# Patient Record
Sex: Female | Born: 1985 | Hispanic: Yes | Marital: Married
Health system: Southern US, Community
[De-identification: ages and names within clinical notes are randomized; demographics above are authoritative.]

---

## 2009-01-09 ENCOUNTER — Emergency Department: Payer: Self-pay | Admitting: Emergency Medicine

## 2009-01-12 ENCOUNTER — Emergency Department: Payer: Self-pay | Admitting: Emergency Medicine

## 2009-01-14 ENCOUNTER — Emergency Department: Payer: Self-pay | Admitting: Emergency Medicine

## 2009-01-27 ENCOUNTER — Emergency Department: Payer: Self-pay | Admitting: Emergency Medicine

## 2009-02-12 ENCOUNTER — Emergency Department: Payer: Self-pay | Admitting: Emergency Medicine

## 2009-02-18 ENCOUNTER — Inpatient Hospital Stay: Payer: Self-pay

## 2009-09-06 ENCOUNTER — Inpatient Hospital Stay: Payer: Self-pay

## 2010-03-21 ENCOUNTER — Ambulatory Visit: Payer: Self-pay | Admitting: Internal Medicine

## 2010-03-21 ENCOUNTER — Inpatient Hospital Stay: Payer: Self-pay | Admitting: Surgery

## 2010-03-23 LAB — PATHOLOGY REPORT

## 2013-12-25 ENCOUNTER — Emergency Department: Payer: Self-pay | Admitting: Emergency Medicine

## 2013-12-25 LAB — COMPREHENSIVE METABOLIC PANEL
ANION GAP: 11 (ref 7–16)
Albumin: 3.9 g/dL (ref 3.4–5.0)
Alkaline Phosphatase: 75 U/L
BILIRUBIN TOTAL: 0.7 mg/dL (ref 0.2–1.0)
BUN: 6 mg/dL — ABNORMAL LOW (ref 7–18)
CHLORIDE: 102 mmol/L (ref 98–107)
CO2: 22 mmol/L (ref 21–32)
CREATININE: 0.51 mg/dL — AB (ref 0.60–1.30)
Calcium, Total: 8.9 mg/dL (ref 8.5–10.1)
Glucose: 98 mg/dL (ref 65–99)
OSMOLALITY: 268 (ref 275–301)
POTASSIUM: 3.8 mmol/L (ref 3.5–5.1)
SGOT(AST): 21 U/L (ref 15–37)
SGPT (ALT): 30 U/L (ref 12–78)
Sodium: 135 mmol/L — ABNORMAL LOW (ref 136–145)
TOTAL PROTEIN: 7.6 g/dL (ref 6.4–8.2)

## 2013-12-25 LAB — URINALYSIS, COMPLETE
BACTERIA: NONE SEEN
BILIRUBIN, UR: NEGATIVE
BLOOD: NEGATIVE
Glucose,UR: NEGATIVE mg/dL (ref 0–75)
Leukocyte Esterase: NEGATIVE
NITRITE: NEGATIVE
Ph: 6 (ref 4.5–8.0)
Protein: 30
RBC,UR: 2 /HPF (ref 0–5)
Specific Gravity: 1.03 (ref 1.003–1.030)
Squamous Epithelial: 4
WBC UR: 2 /HPF (ref 0–5)

## 2013-12-25 LAB — CBC
HCT: 42.4 % (ref 35.0–47.0)
HGB: 14.7 g/dL (ref 12.0–16.0)
MCH: 29.6 pg (ref 26.0–34.0)
MCHC: 34.6 g/dL (ref 32.0–36.0)
MCV: 86 fL (ref 80–100)
Platelet: 224 10*3/uL (ref 150–440)
RBC: 4.96 10*6/uL (ref 3.80–5.20)
RDW: 13.3 % (ref 11.5–14.5)
WBC: 9.4 10*3/uL (ref 3.6–11.0)

## 2013-12-25 LAB — HCG, QUANTITATIVE, PREGNANCY: Beta Hcg, Quant.: 64133 m[IU]/mL — ABNORMAL HIGH

## 2013-12-28 ENCOUNTER — Emergency Department: Payer: Self-pay | Admitting: Emergency Medicine

## 2013-12-29 LAB — COMPREHENSIVE METABOLIC PANEL
ALBUMIN: 3.8 g/dL (ref 3.4–5.0)
ALK PHOS: 76 U/L
ALT: 33 U/L (ref 12–78)
ANION GAP: 11 (ref 7–16)
AST: 20 U/L (ref 15–37)
BILIRUBIN TOTAL: 0.4 mg/dL (ref 0.2–1.0)
BUN: 7 mg/dL (ref 7–18)
Calcium, Total: 8.7 mg/dL (ref 8.5–10.1)
Chloride: 101 mmol/L (ref 98–107)
Co2: 24 mmol/L (ref 21–32)
Creatinine: 0.45 mg/dL — ABNORMAL LOW (ref 0.60–1.30)
EGFR (African American): >60
EGFR (Non-African Amer.): >60
GLUCOSE: 98 mg/dL (ref 65–99)
Osmolality: 270 (ref 275–301)
Potassium: 3.4 mmol/L — ABNORMAL LOW (ref 3.5–5.1)
SODIUM: 136 mmol/L (ref 136–145)
Total Protein: 7.3 g/dL (ref 6.4–8.2)

## 2013-12-29 LAB — CBC WITH DIFFERENTIAL/PLATELET
Basophil #: 0 10*3/uL (ref 0.0–0.1)
Basophil %: 0.3 %
EOS PCT: 1 %
Eosinophil #: 0.1 10*3/uL (ref 0.0–0.7)
HCT: 40.1 % (ref 35.0–47.0)
HGB: 13.8 g/dL (ref 12.0–16.0)
LYMPHS ABS: 1.5 10*3/uL (ref 1.0–3.6)
LYMPHS PCT: 15 %
MCH: 29.1 pg (ref 26.0–34.0)
MCHC: 34.4 g/dL (ref 32.0–36.0)
MCV: 85 fL (ref 80–100)
MONOS PCT: 7.9 %
Monocyte #: 0.8 x10 3/mm (ref 0.2–0.9)
Neutrophil #: 7.5 10*3/uL — ABNORMAL HIGH (ref 1.4–6.5)
Neutrophil %: 75.8 %
Platelet: 203 10*3/uL (ref 150–440)
RBC: 4.74 10*6/uL (ref 3.80–5.20)
RDW: 12.9 % (ref 11.5–14.5)
WBC: 9.8 10*3/uL (ref 3.6–11.0)

## 2013-12-29 LAB — URINALYSIS, COMPLETE
Bilirubin,UR: NEGATIVE
Blood: NEGATIVE
Glucose,UR: NEGATIVE mg/dL (ref 0–75)
Leukocyte Esterase: NEGATIVE
Nitrite: NEGATIVE
Ph: 6 (ref 4.5–8.0)
RBC,UR: 3 /HPF (ref 0–5)
Specific Gravity: 1.032 (ref 1.003–1.030)
WBC UR: 5 /HPF (ref 0–5)

## 2013-12-29 LAB — LIPASE, BLOOD: LIPASE: 199 U/L (ref 73–393)

## 2013-12-29 LAB — HCG, QUANTITATIVE, PREGNANCY: Beta Hcg, Quant.: 80553 m[IU]/mL — ABNORMAL HIGH

## 2013-12-29 LAB — TROPONIN I: Troponin-I: <0.02 ng/mL

## 2014-01-01 ENCOUNTER — Emergency Department: Payer: Self-pay | Admitting: Emergency Medicine

## 2014-01-01 LAB — URINALYSIS, COMPLETE
BILIRUBIN, UR: NEGATIVE
Blood: NEGATIVE
Glucose,UR: NEGATIVE mg/dL (ref 0–75)
Leukocyte Esterase: NEGATIVE
Nitrite: NEGATIVE
Ph: 6 (ref 4.5–8.0)
RBC,UR: 5 /HPF (ref 0–5)
Specific Gravity: 1.031 (ref 1.003–1.030)
Squamous Epithelial: 8
WBC UR: 5 /HPF (ref 0–5)

## 2014-01-01 LAB — BASIC METABOLIC PANEL
Anion Gap: 6 — ABNORMAL LOW (ref 7–16)
BUN: 5 mg/dL — AB (ref 7–18)
CALCIUM: 8.9 mg/dL (ref 8.5–10.1)
CHLORIDE: 103 mmol/L (ref 98–107)
Co2: 22 mmol/L (ref 21–32)
Creatinine: 0.58 mg/dL — ABNORMAL LOW (ref 0.60–1.30)
EGFR (African American): >60
EGFR (Non-African Amer.): >60
GLUCOSE: 103 mg/dL — AB (ref 65–99)
OSMOLALITY: 260 (ref 275–301)
Potassium: 3.6 mmol/L (ref 3.5–5.1)
Sodium: 131 mmol/L — ABNORMAL LOW (ref 136–145)

## 2014-01-01 LAB — CBC
HCT: 42.8 % (ref 35.0–47.0)
HGB: 14.7 g/dL (ref 12.0–16.0)
MCH: 28.8 pg (ref 26.0–34.0)
MCHC: 34.5 g/dL (ref 32.0–36.0)
MCV: 84 fL (ref 80–100)
PLATELETS: 205 10*3/uL (ref 150–440)
RBC: 5.13 10*6/uL (ref 3.80–5.20)
RDW: 13.2 % (ref 11.5–14.5)
WBC: 8.1 10*3/uL (ref 3.6–11.0)

## 2014-01-01 LAB — LIPASE, BLOOD: Lipase: 374 U/L (ref 73–393)

## 2014-01-05 ENCOUNTER — Inpatient Hospital Stay: Payer: Self-pay | Admitting: Obstetrics and Gynecology

## 2014-01-05 LAB — COMPREHENSIVE METABOLIC PANEL
AST: 19 U/L (ref 15–37)
Albumin: 3.5 g/dL (ref 3.4–5.0)
Alkaline Phosphatase: 76 U/L
Anion Gap: 9 (ref 7–16)
BUN: 4 mg/dL — ABNORMAL LOW (ref 7–18)
Bilirubin,Total: 0.5 mg/dL (ref 0.2–1.0)
CREATININE: 0.48 mg/dL — AB (ref 0.60–1.30)
Calcium, Total: 8.5 mg/dL (ref 8.5–10.1)
Chloride: 105 mmol/L (ref 98–107)
Co2: 24 mmol/L (ref 21–32)
EGFR (Non-African Amer.): >60
Glucose: 82 mg/dL (ref 65–99)
OSMOLALITY: 272 (ref 275–301)
Potassium: 3.6 mmol/L (ref 3.5–5.1)
SGPT (ALT): 31 U/L (ref 12–78)
SODIUM: 138 mmol/L (ref 136–145)
Total Protein: 7 g/dL (ref 6.4–8.2)

## 2014-01-05 LAB — CBC WITH DIFFERENTIAL/PLATELET
Basophil #: 0 10*3/uL (ref 0.0–0.1)
Basophil %: 0.3 %
EOS ABS: 0.1 10*3/uL (ref 0.0–0.7)
EOS PCT: 1.3 %
HCT: 39.2 % (ref 35.0–47.0)
HGB: 13.6 g/dL (ref 12.0–16.0)
LYMPHS ABS: 1.5 10*3/uL (ref 1.0–3.6)
Lymphocyte %: 17.5 %
MCH: 29.1 pg (ref 26.0–34.0)
MCHC: 34.7 g/dL (ref 32.0–36.0)
MCV: 84 fL (ref 80–100)
Monocyte #: 0.8 x10 3/mm (ref 0.2–0.9)
Monocyte %: 9.2 %
NEUTROS ABS: 6.1 10*3/uL (ref 1.4–6.5)
NEUTROS PCT: 71.7 %
Platelet: 199 10*3/uL (ref 150–440)
RBC: 4.68 10*6/uL (ref 3.80–5.20)
RDW: 13.1 % (ref 11.5–14.5)
WBC: 8.5 10*3/uL (ref 3.6–11.0)

## 2014-01-05 LAB — URINALYSIS, COMPLETE
Bilirubin,UR: NEGATIVE
Blood: NEGATIVE
GLUCOSE, UR: NEGATIVE mg/dL (ref 0–75)
Leukocyte Esterase: NEGATIVE
Nitrite: NEGATIVE
PH: 6 (ref 4.5–8.0)
Protein: 100
Specific Gravity: 1.026 (ref 1.003–1.030)
Squamous Epithelial: 2

## 2014-01-05 LAB — MAGNESIUM: Magnesium: 1.6 mg/dL — ABNORMAL LOW

## 2014-01-07 LAB — COMPREHENSIVE METABOLIC PANEL
ALK PHOS: 66 U/L
ALT: 36 U/L
Albumin: 2.9 g/dL — ABNORMAL LOW (ref 3.4–5.0)
Anion Gap: 7 (ref 7–16)
BUN: 2 mg/dL — AB (ref 7–18)
Bilirubin,Total: 0.2 mg/dL (ref 0.2–1.0)
CALCIUM: 7.2 mg/dL — AB (ref 8.5–10.1)
CO2: 24 mmol/L (ref 21–32)
Chloride: 105 mmol/L (ref 98–107)
Creatinine: 0.58 mg/dL — ABNORMAL LOW (ref 0.60–1.30)
EGFR (Non-African Amer.): >60
GLUCOSE: 126 mg/dL — AB (ref 65–99)
Osmolality: 270 (ref 275–301)
Potassium: 3.2 mmol/L — ABNORMAL LOW (ref 3.5–5.1)
SGOT(AST): 26 U/L (ref 15–37)
Sodium: 136 mmol/L (ref 136–145)
TOTAL PROTEIN: 5.8 g/dL — AB (ref 6.4–8.2)

## 2014-01-07 LAB — MAGNESIUM: Magnesium: 1.7 mg/dL — ABNORMAL LOW

## 2014-01-07 LAB — T4, FREE: Free Thyroxine: 1.21 ng/dL (ref 0.76–1.46)

## 2014-01-07 LAB — TSH: Thyroid Stimulating Horm: 0.42 u[IU]/mL — ABNORMAL LOW

## 2014-01-09 LAB — URINALYSIS, COMPLETE
BLOOD: NEGATIVE
Bilirubin,UR: NEGATIVE
Ketone: NEGATIVE
Nitrite: NEGATIVE
Ph: 8 (ref 4.5–8.0)
Protein: NEGATIVE
RBC,UR: <1 /HPF (ref 0–5)
Specific Gravity: 1.003 (ref 1.003–1.030)

## 2014-01-11 LAB — BASIC METABOLIC PANEL
Anion Gap: 8 (ref 7–16)
BUN: 1 mg/dL — ABNORMAL LOW (ref 7–18)
CALCIUM: 8 mg/dL — AB (ref 8.5–10.1)
CHLORIDE: 106 mmol/L (ref 98–107)
CREATININE: 0.37 mg/dL — AB (ref 0.60–1.30)
Co2: 22 mmol/L (ref 21–32)
EGFR (Non-African Amer.): >60
GLUCOSE: 105 mg/dL — AB (ref 65–99)
OSMOLALITY: 268 (ref 275–301)
POTASSIUM: 3.6 mmol/L (ref 3.5–5.1)
Sodium: 136 mmol/L (ref 136–145)

## 2014-01-11 LAB — MAGNESIUM: MAGNESIUM: 1.7 mg/dL — AB

## 2014-01-14 LAB — BASIC METABOLIC PANEL
Anion Gap: 10 (ref 7–16)
BUN: 2 mg/dL — ABNORMAL LOW (ref 7–18)
CHLORIDE: 105 mmol/L (ref 98–107)
Calcium, Total: 8.1 mg/dL — ABNORMAL LOW (ref 8.5–10.1)
Co2: 20 mmol/L — ABNORMAL LOW (ref 21–32)
Creatinine: 0.46 mg/dL — ABNORMAL LOW (ref 0.60–1.30)
EGFR (African American): >60
Glucose: 135 mg/dL — ABNORMAL HIGH (ref 65–99)
OSMOLALITY: 268 (ref 275–301)
POTASSIUM: 3.4 mmol/L — AB (ref 3.5–5.1)
SODIUM: 135 mmol/L — AB (ref 136–145)

## 2014-01-14 LAB — MAGNESIUM: Magnesium: 1.5 mg/dL — ABNORMAL LOW

## 2014-01-16 LAB — BASIC METABOLIC PANEL
Anion Gap: 9 (ref 7–16)
BUN: 2 mg/dL — ABNORMAL LOW (ref 7–18)
Calcium, Total: 8.4 mg/dL — ABNORMAL LOW (ref 8.5–10.1)
Chloride: 105 mmol/L (ref 98–107)
Co2: 22 mmol/L (ref 21–32)
Creatinine: 0.42 mg/dL — ABNORMAL LOW (ref 0.60–1.30)
EGFR (African American): >60
Glucose: 84 mg/dL (ref 65–99)
Osmolality: 267 (ref 275–301)
Potassium: 3.7 mmol/L (ref 3.5–5.1)
Sodium: 136 mmol/L (ref 136–145)

## 2014-01-16 LAB — MAGNESIUM: Magnesium: 1.8 mg/dL

## 2014-01-18 LAB — BASIC METABOLIC PANEL
ANION GAP: 7 (ref 7–16)
BUN: 5 mg/dL — ABNORMAL LOW (ref 7–18)
CALCIUM: 8.8 mg/dL (ref 8.5–10.1)
CHLORIDE: 102 mmol/L (ref 98–107)
CO2: 25 mmol/L (ref 21–32)
CREATININE: 0.58 mg/dL — AB (ref 0.60–1.30)
EGFR (African American): >60
EGFR (Non-African Amer.): >60
Glucose: 94 mg/dL (ref 65–99)
OSMOLALITY: 265 (ref 275–301)
Potassium: 3.7 mmol/L (ref 3.5–5.1)
Sodium: 134 mmol/L — ABNORMAL LOW (ref 136–145)

## 2014-01-18 LAB — MAGNESIUM: Magnesium: 1.8 mg/dL

## 2014-03-16 ENCOUNTER — Ambulatory Visit: Payer: Self-pay | Admitting: Physician Assistant

## 2014-04-07 ENCOUNTER — Emergency Department: Payer: Self-pay | Admitting: Student

## 2014-08-05 ENCOUNTER — Inpatient Hospital Stay: Payer: Self-pay | Admitting: Obstetrics and Gynecology

## 2014-10-10 NOTE — Consult Note (Signed)
PIC line in and running.  Pt getting meds via this.  She complains of feeling anxiety after getting the Reglan and this lasts about 15 minutes and goes away.  To lessen the anxiety effects of this medicine I have asked the pharmacy to put it in a 50cc med bag to run over 15 minutes, and will do the same with the Phenergan. I have minimal experience in management of hyperemesis gravidarum and I prefer OB-GYN physicians to make medicine changes or recommendations if alternative medications are needed and i will contribute what I can to fine tune them to help with the patient's particular idiosyncratic side effect.  Electronic Signatures: Scot JunElliott, Wandalee Klang T (MD)  (Signed on 25-Jul-15 15:29)  Authored  Last Updated: 25-Jul-15 15:29 by Scot JunElliott, Kuuipo Anzaldo T (MD)

## 2014-10-10 NOTE — Consult Note (Signed)
May be some component of mucosal irritation from the frequent vomiting she had a few days ago.  She might benefit from liquid carafate one gram 30-60 min before meals, it sticks to inflammed tissue.  I did not previously recommend it for her until I was sure her vomiting had stopped.  Electronic Signatures: Scot JunElliott, Traci Plemons T (MD)  (Signed on 28-Jul-15 13:35)  Authored  Last Updated: 28-Jul-15 13:35 by Scot JunElliott, Salina Stanfield T (MD)

## 2014-10-10 NOTE — Consult Note (Signed)
Pt feeling better, tolerates Zofran better than phenergan/reglan.  Abd not distended, not tender.  Seems to a bit more animated and smiling; better than over weekend. Continue course until you think can be discharged. Slow advance of diet.  Electronic Signatures: Scot JunElliott, Robert T (MD)  (Signed on 27-Jul-15 18:02)  Authored  Last Updated: 27-Jul-15 18:02 by Scot JunElliott, Robert T (MD)

## 2014-10-10 NOTE — Consult Note (Signed)
Per provider note patient making progress each day.  I will sign off, please reconsult if I can be of service.  Electronic Signatures: Scot JunElliott, Shahid Flori T (MD)  (Signed on 28-Jul-15 13:01)  Authored  Last Updated: 28-Jul-15 13:01 by Scot JunElliott, Chene Kasinger T (MD)
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
## 2014-10-10 NOTE — Consult Note (Signed)
Pt no further vomiting. She does not want to try liquids yet.  Very flat affect,  Abd flat, no hepatosplenomegaly.  Giving the reglan in med bag over 15-20 min has improved the spells of anxiety she was having after iv push administration.  I have no further recommendations, consider psy referral if you see signs of depression.  Electronic Signatures: Scot JunElliott, Maicee Ullman T (MD)  (Signed on 26-Jul-15 10:10)  Authored  Last Updated: 26-Jul-15 10:10 by Scot JunElliott, Bingham Millette T (MD)

## 2014-10-27 NOTE — H&P (Signed)
L&D Evaluation:  History:  HPI [age] y/o G2P1001 arrived last night in early labor. Irregular uc's small bloody show, denies leaking fluid, baby is active. Care @ [facility], well pregnancy GBS positive   Presents with contractions   Patient's Medical History No Chronic Illness   Patient's Surgical History none   Medications Pre Natal Vitamins   Allergies NKDA   Social History none   Family History Non-Contributory   ROS:  ROS All systems were reviewed.  HEENT, CNS, GI, GU, Respiratory, CV, Renal and Musculoskeletal systems were found to be normal.   Exam:  Vital Signs stable   Urine Protein not completed   General no apparent distress   Mental Status clear   Chest clear   Heart normal sinus rhythm   Abdomen gravid, non-tender   Estimated Fetal Weight Average for gestational age   Fetal Position vtx   Fundal Height term   Back no CVAT   Edema no edema   Reflexes 1+   Clonus negative   Pelvic no external lesions, 5cm 80% vtx @ -2 cx posterior BOWI   Mebranes Intact   FHT normal rate with no decels, baseline 140's avg variabillity with accels   Fetal Heart Rate 144   Ucx regular, Q 3/4 mins 60 sec moderate   Skin dry   Impression:  Impression active labor   Plan:  Plan monitor contractions and for cervical change, antibiotics for GBBS prophylaxis   Comments Admitted, explained what to expect. IV ABX begun, declines pain meds for now. Plans natural childbirth. WIll ambulate/intermittent EFM as desired.   Electronic Signatures: Kaitlyn Shah, Kaitlyn Shah (CNM)  (Signed 17-Feb-16 09:18)  Authored: L&D Evaluation   Last Updated: 17-Feb-16 09:18 by Kaitlyn Shah, Kaitlyn Shah (CNM)

## 2016-01-30 IMAGING — CR DG CHEST 1V PORT
1 series · 1 of 1 positions shown · non-contrast
Comparison: Chest x-ray 01/10/2014.

CLINICAL DATA: PICC placement.

EXAM:
PORTABLE CHEST - 1 VIEW

[ap]
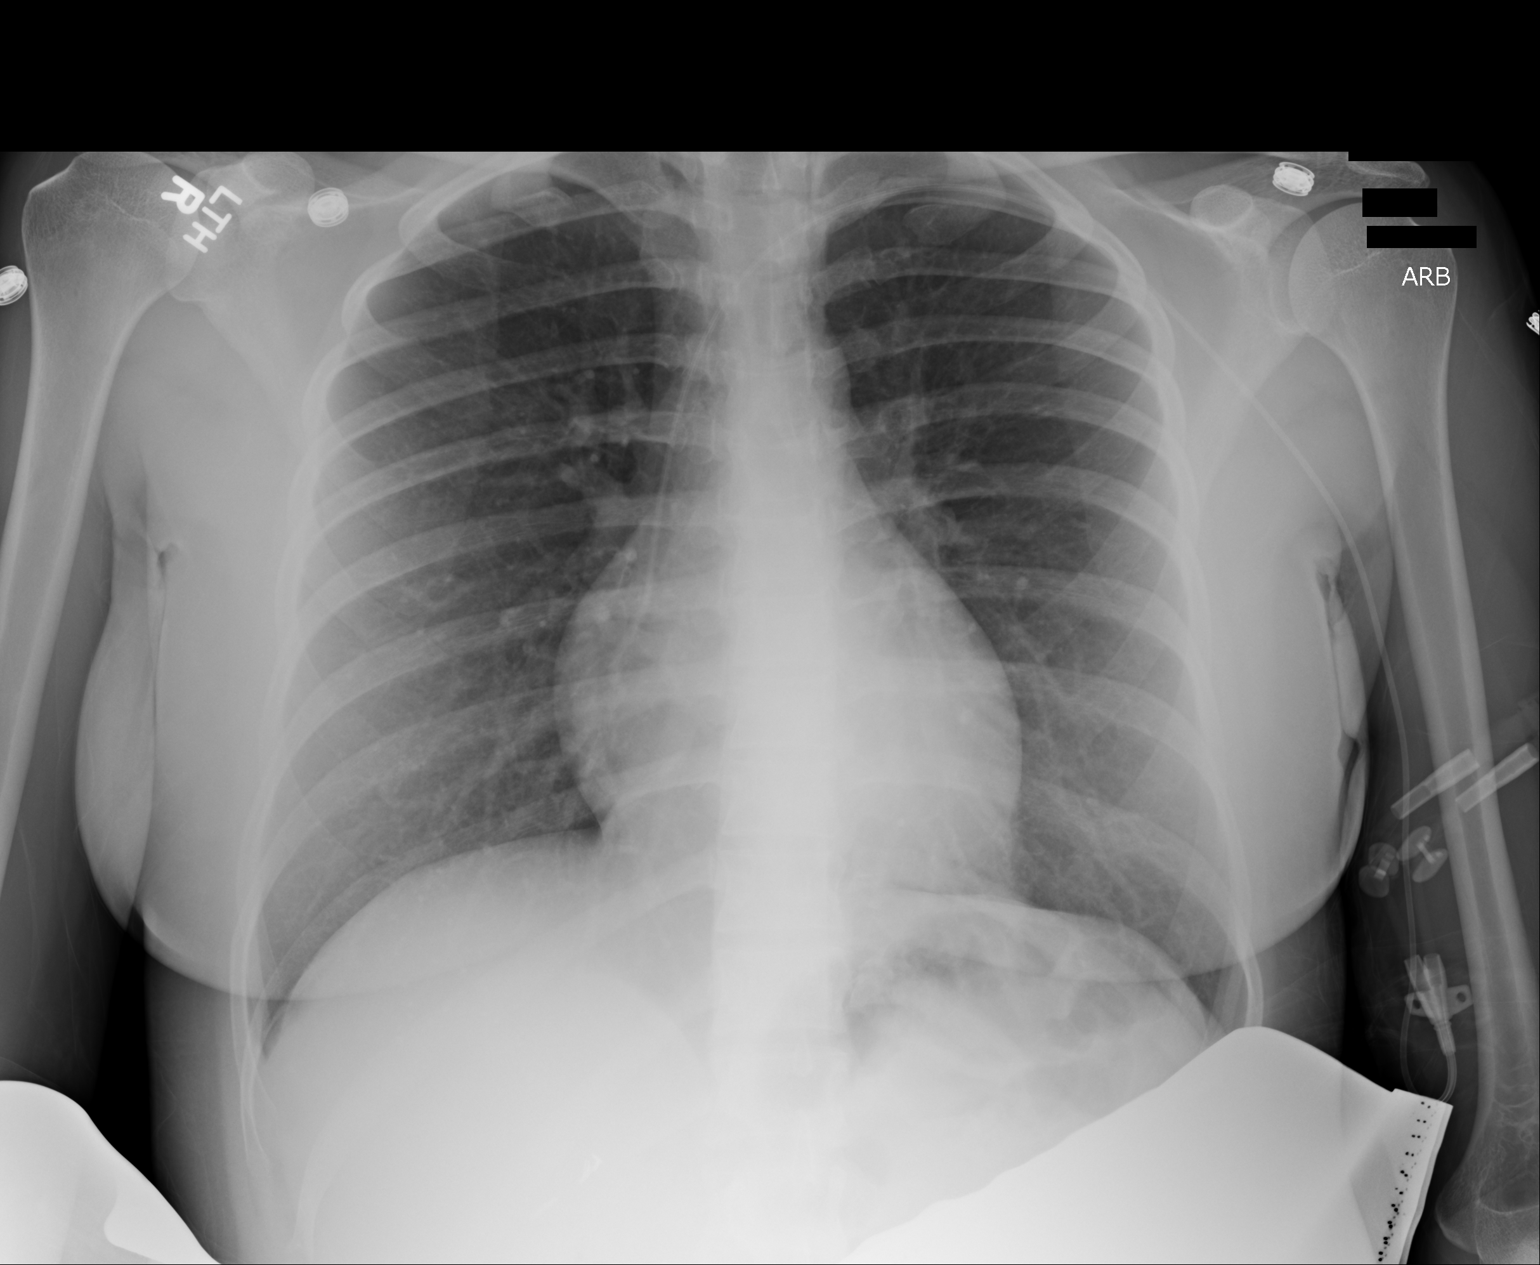

[1 of 1 positions shown; findings below may reference images not displayed]

FINDINGS: Previously noted left upper extremity PICC has been withdrawn
slightly, now with tip at the level of the superior cavoatrial
junction. Lung volumes are normal. No consolidative airspace
disease. No pleural effusions. No pneumothorax. No pulmonary nodule
or mass noted. Pulmonary vasculature and the cardiomediastinal
silhouette are within normal limits.
IMPRESSION: 1. Tip of left upper extremity PICC is at the left superior
cavoatrial junction.
2. No radiographic evidence of acute cardiopulmonary disease.
These results were called by telephone at the time of interpretation
on 01/10/2014 at [DATE] to nurse Bueno for Dr. DAVID FREDDY NV, who
verbally acknowledged these results.

## 2016-04-04 IMAGING — US US OB US >=[ID] SNGL FETUS
1 series · 13 of 28 positions shown · non-contrast
Comparison: none

CLINICAL DATA: Anatomic scan

EXAM:
ULTRASOUND OB >=2YUTL SINGLE FETUS

[Series 1: us ob us >=(id) sngl fetus · 0.21mm/px · 13 of 98 slices shown]
[im 4/98]
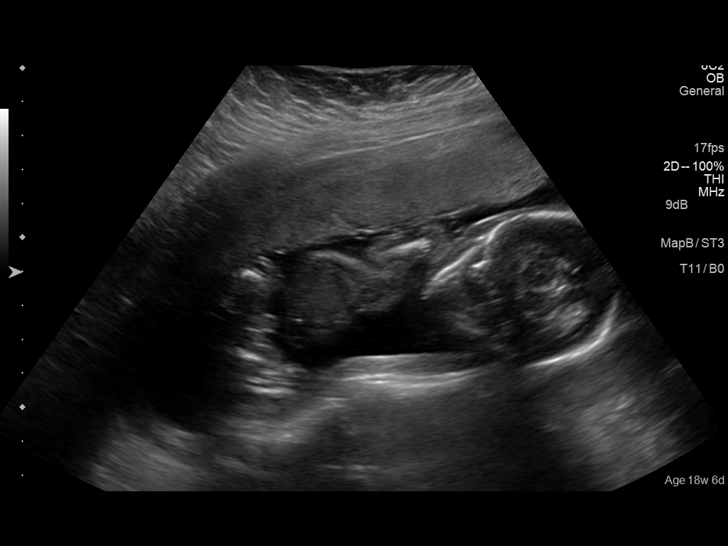
[im 11/98]
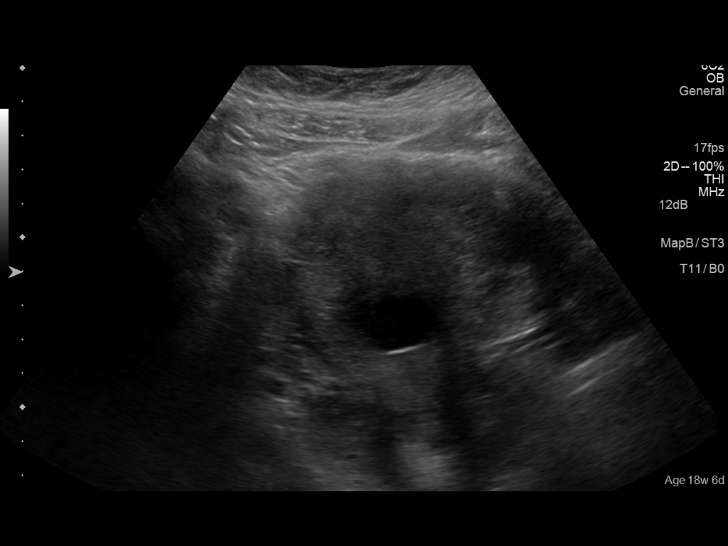
[im 18/98]
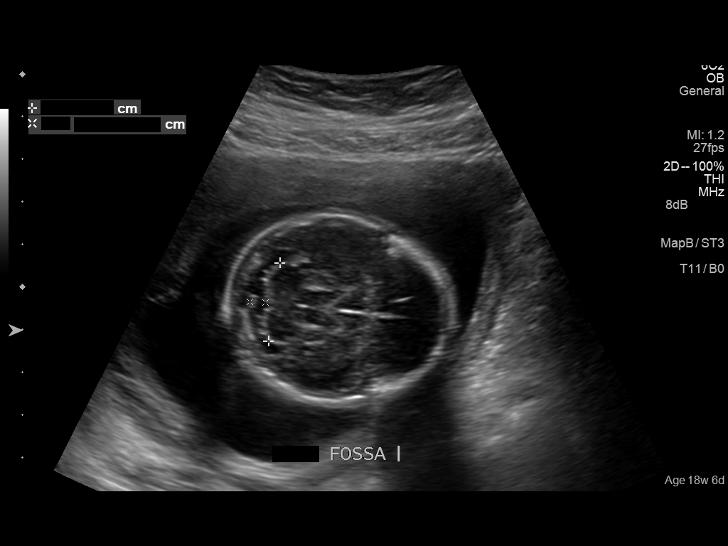
[im 26/98]
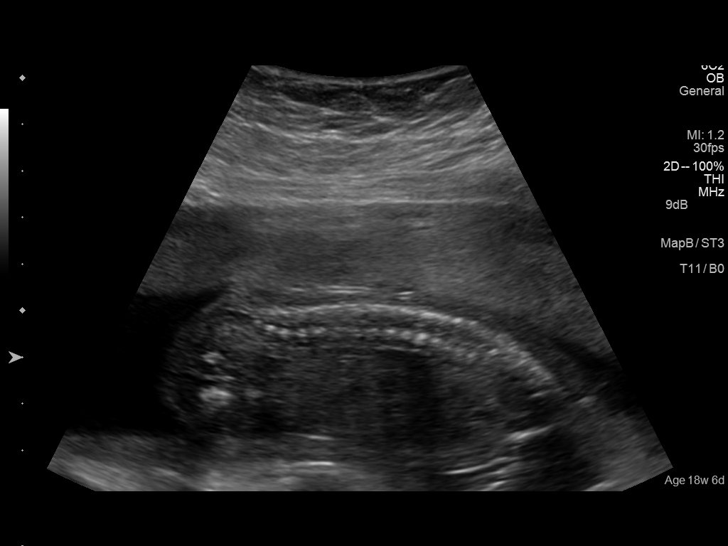
[im 33/98]
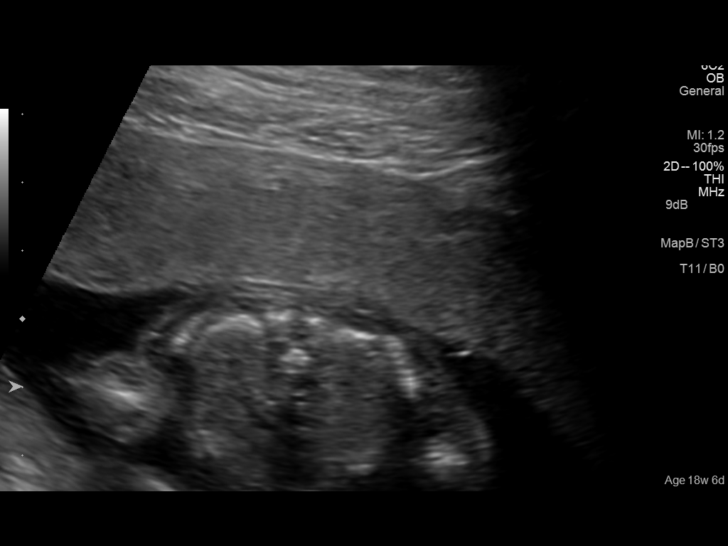
[im 40/98]
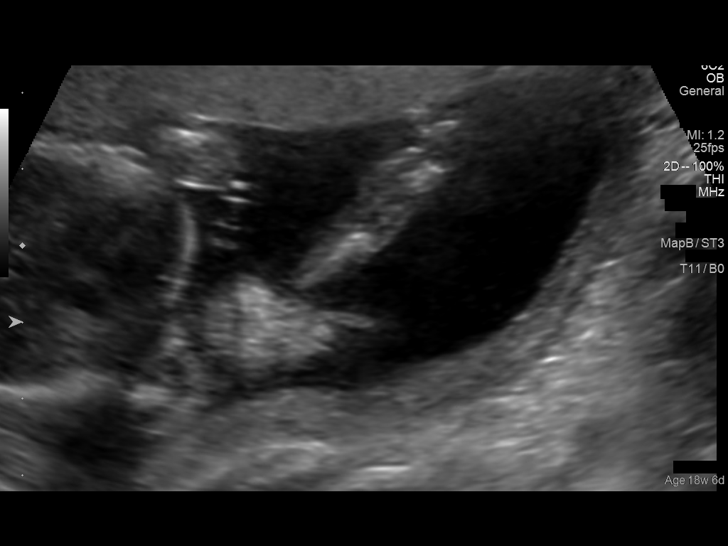
[im 51/98]
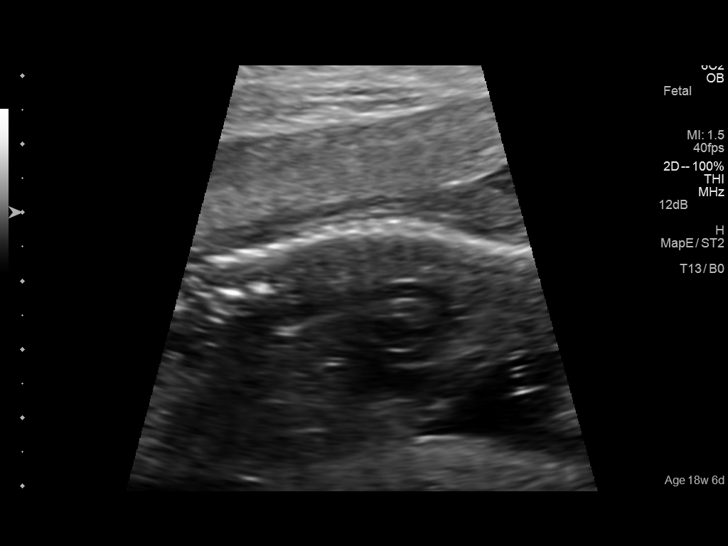
[im 58/98]
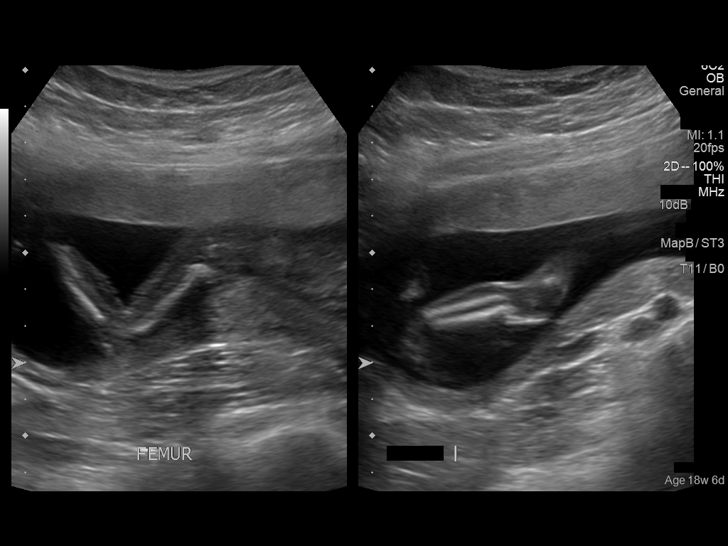
[im 65/98]
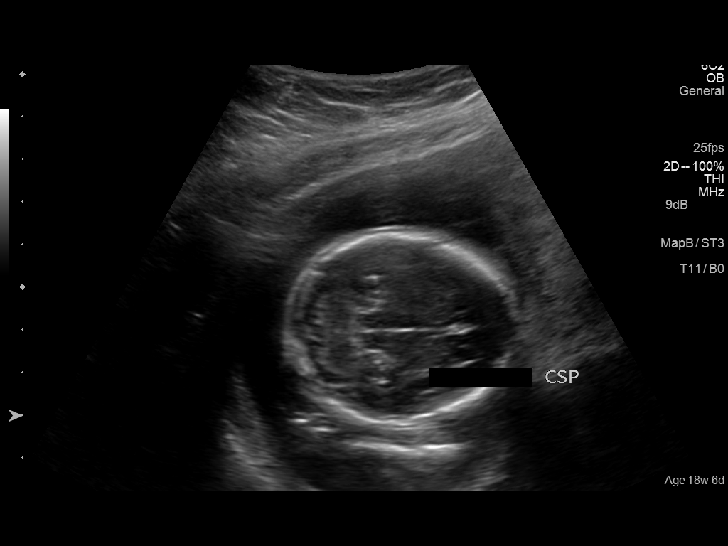
[im 72/98]
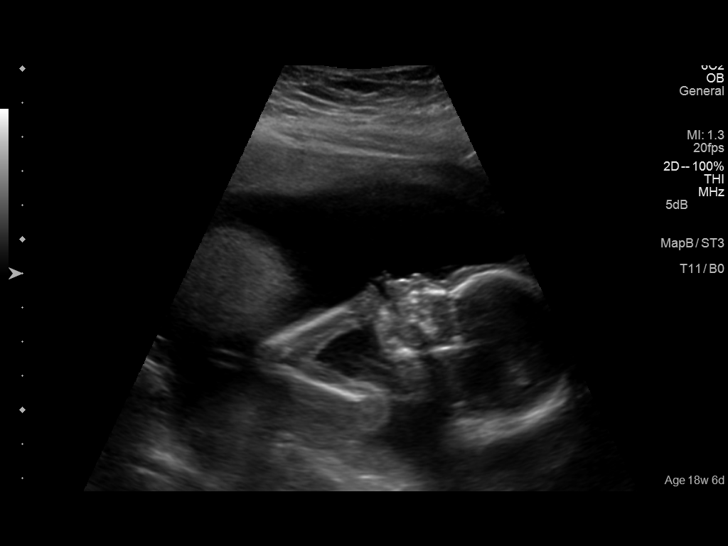
[im 80/98]
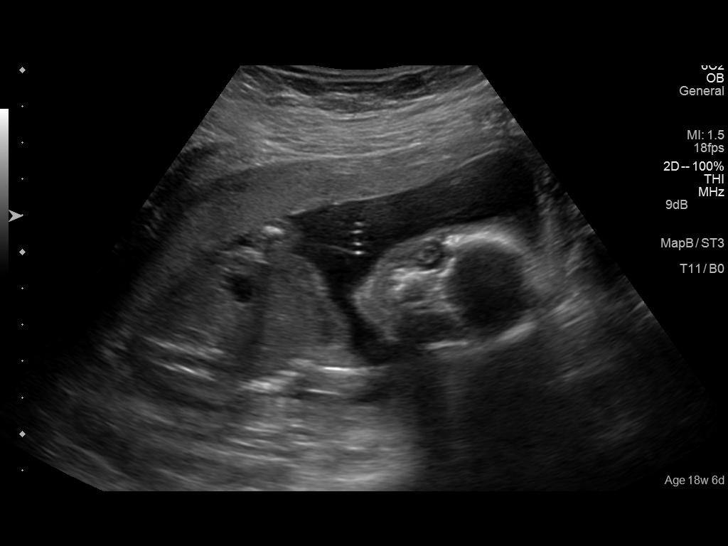
[im 87/98]
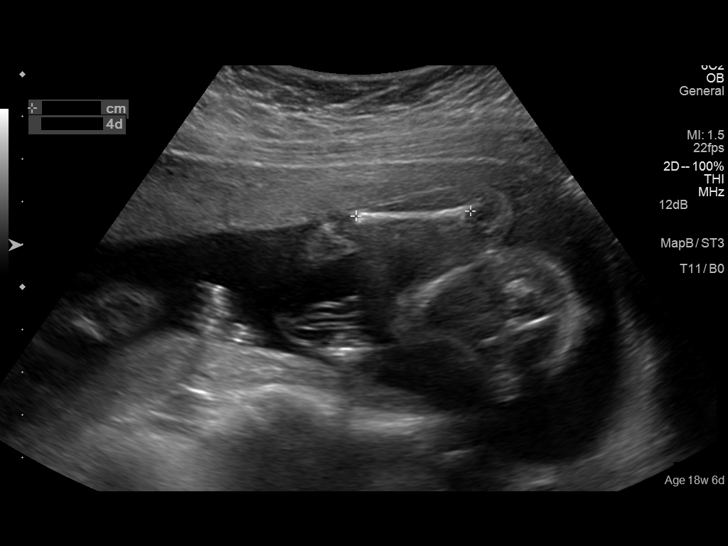
[im 94/98]
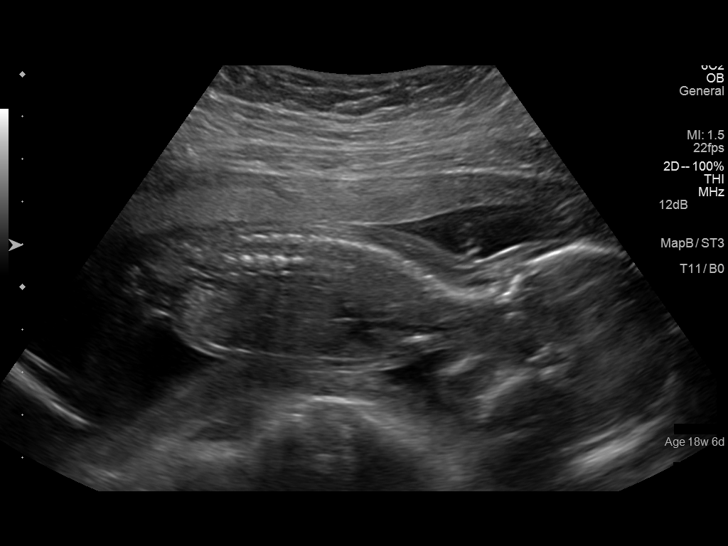

[13 of 28 positions shown; findings below may reference images not displayed]

FINDINGS: Number of Fetuses: 1

Heart Rate:  144 bpm

Movement: Present

Presentation: Cephalic

Previa: .  There is no evidence of placenta previa

Placental Location: Anterior and partially fundal

Amniotic Fluid (Subjective): Normal

FETAL BIOMETRY

BPD:  43cm 19w 1d

HC:    15.9cm  18w   5d

AC:   12.7cm  18Erauw   Using Geun

LARKI:   2.9cm  18w   6d

Current Mean GA: 18w 5d

US EDC: August 12, 2014.

Assigned GA:  18w 6d

Assigned EDC:  August 11, 2014

Estimated Fetal Weight:  811grams 0lb 9oz  32%

FETAL ANATOMY

Lateral Ventricles: Normal

Thalami/CSP: Normal

Posterior Fossa:  Normal

Nuchal Region: Normal    NFT= 1.8mm

Spine: Normal

Upper Lip: Normal

4 Chamber Heart on Left: .  Present

Stomach on Left: Present

3 Vessel Cord: Present

Cord Insertion site: Normal

Kidneys: Normal

Bladder: Normal

Extremities: Normal where visualized

Additional Anatomy Visualized: The previously described 3 mm
umbilical cord cyst is not evident today.

Maternal Findings:

Cervix:  4.5 cm  cm  transabdominally
IMPRESSION: There is a viable IUP with estimated gestational age of 18 weeks 5
days with estimated date of confinement August 12, 2014. The
presentation is cephalic and the placenta is anterior and partially
fundal without evidence of previa. The amniotic fluid volume is
estimated to be normal. No fetal anomalies are demonstrated.

## 2022-08-17 ENCOUNTER — Other Ambulatory Visit: Payer: Self-pay

## 2022-08-17 ENCOUNTER — Emergency Department: Payer: Self-pay

## 2022-08-17 ENCOUNTER — Emergency Department
Admission: EM | Admit: 2022-08-17 | Discharge: 2022-08-18 | Disposition: A | Discharge status: Home / Self Care | Payer: Self-pay | Attending: Emergency Medicine | Admitting: Emergency Medicine

## 2022-08-17 DIAGNOSIS — N390 Urinary tract infection, site not specified: Secondary | ICD-10-CM | POA: Insufficient documentation

## 2022-08-17 DIAGNOSIS — N3001 Acute cystitis with hematuria: Secondary | ICD-10-CM | POA: Insufficient documentation

## 2022-08-17 DIAGNOSIS — R109 Unspecified abdominal pain: Secondary | ICD-10-CM

## 2022-08-17 LAB — CBC
HCT: 41.5 % (ref 36.0–46.0)
Hemoglobin: 14.1 g/dL (ref 12.0–15.0)
MCH: 28.3 pg (ref 26.0–34.0)
MCHC: 34 g/dL (ref 30.0–36.0)
MCV: 83.3 fL (ref 80.0–100.0)
Platelets: 292 10*3/uL (ref 150–400)
RBC: 4.98 MIL/uL (ref 3.87–5.11)
RDW: 12.8 % (ref 11.5–15.5)
WBC: 16.9 10*3/uL — ABNORMAL HIGH (ref 4.0–10.5)
nRBC: 0 % (ref 0.0–0.2)

## 2022-08-17 LAB — BASIC METABOLIC PANEL
Anion gap: 11 (ref 5–15)
BUN: 7 mg/dL (ref 6–20)
CO2: 22 mmol/L (ref 22–32)
Calcium: 9.2 mg/dL (ref 8.9–10.3)
Chloride: 105 mmol/L (ref 98–111)
Creatinine, Ser: 0.59 mg/dL (ref 0.44–1.00)
GFR, Estimated: >60 mL/min (ref >60)
Glucose, Bld: 136 mg/dL — ABNORMAL HIGH (ref 70–99)
Potassium: 3.3 mmol/L — ABNORMAL LOW (ref 3.5–5.1)
Sodium: 138 mmol/L (ref 135–145)

## 2022-08-17 LAB — URINALYSIS, ROUTINE W REFLEX MICROSCOPIC
Bilirubin Urine: NEGATIVE
Glucose, UA: NEGATIVE mg/dL
Ketones, ur: NEGATIVE mg/dL
Nitrite: NEGATIVE
Protein, ur: >=300 mg/dL — AB
RBC / HPF: >50 RBC/hpf (ref 0–5)
Specific Gravity, Urine: 1.014 (ref 1.005–1.030)
WBC, UA: >50 WBC/hpf (ref 0–5)
pH: 6 (ref 5.0–8.0)

## 2022-08-17 LAB — PREGNANCY, URINE: Preg Test, Ur: NEGATIVE

## 2022-08-17 MED ORDER — PHENAZOPYRIDINE HCL 100 MG PO TABS: 100.0000 mg | ORAL_TABLET | Freq: Three times a day (TID) | ORAL | 0 refills | Status: AC | PRN

## 2022-08-17 MED ORDER — SODIUM CHLORIDE 0.9 % IV SOLN
2.0000 g | Freq: Once | INTRAVENOUS | Status: AC
  Administered 2022-08-17: 2 g via INTRAVENOUS
  Filled 2022-08-17: qty 20

## 2022-08-17 MED ORDER — CEPHALEXIN 500 MG PO CAPS: 500.0000 mg | ORAL_CAPSULE | Freq: Three times a day (TID) | ORAL | 0 refills | Status: AC

## 2022-08-17 NOTE — ED Provider Notes (Signed)
[facility] Emergency Department Provider Note     Event Date/Time   First MD Initiated Contact with Patient 08/17/22 2047     (approximate)   History   UTI   HPI  History limited by Spanish language.  Tele-interpreter [phone]) used for interview and exam.  Kaitlyn Shah [place] is a 37 y.o. female presents to the ED from her PCP for evaluation of possible UTI.  Patient reports left flank pain, dysuria, as well as hematuria.  Denies any fevers, chills, sweats, chest pain patient does report some bladder spasm and pain with urination.  No history of recurrent UTIs or kidney stones.  Physical Exam   Triage Vital Signs: ED Triage Vitals [08/17/22 1752]  Enc Vitals Group     BP (!) 145/103     Pulse Rate 85     Resp 18     Temp 98.3 F (36.8 C)     Temp src      SpO2 98 %     Weight      Height      Head Circumference      Peak Flow      Pain Score      Pain Loc      Pain Edu?      Excl. in [place]?     Most recent vital signs: Vitals:   08/17/22 1752  BP: (!) 145/103  Pulse: 85  Resp: 18  Temp: 98.3 F (36.8 C)  SpO2: 98%    General Awake, no distress. NAD CV:  Good peripheral perfusion.  RESP:  Normal effort.  ABD:  No distention.    ED Results / Procedures / Treatments   Labs (all labs ordered are listed, but only abnormal results are displayed) Labs Reviewed  URINALYSIS, ROUTINE W REFLEX MICROSCOPIC - Abnormal; Notable for the following components:      Result Value   Color, Urine AMBER (*)    APPearance CLOUDY (*)    Hgb urine dipstick LARGE (*)    Protein, ur >=300 (*)    Leukocytes,Ua LARGE (*)    Bacteria, UA RARE (*)    Non Squamous Epithelial PRESENT (*)    All other components within normal limits  CBC - Abnormal; Notable for the following components:   WBC 16.9 (*)    All other components within normal limits  BASIC METABOLIC PANEL - Abnormal; Notable for the following components:   Potassium 3.3 (*)     Glucose, Bld 136 (*)    All other components within normal limits  URINE CULTURE  PREGNANCY, URINE     EKG    RADIOLOGY  I personally viewed and evaluated these images as part of my medical decision making, as well as reviewing the written report by the radiologist.  ED Provider Interpretation: no acute findings. No hydronephrosis/nephrolithiasis  CT Renal Stone Study  Result Date: 08/17/2022 CLINICAL DATA:  Abdominal/flank pain. Pain and blood in urine. Left-sided pain. EXAM: CT ABDOMEN AND PELVIS WITHOUT CONTRAST TECHNIQUE: Multidetector CT imaging of the abdomen and pelvis was performed following the standard protocol without IV contrast. RADIATION DOSE REDUCTION: This exam was performed according to the departmental dose-optimization program which includes automated exposure control, adjustment of the mA and/or kV according to patient size and/or use of iterative reconstruction technique. COMPARISON:  None Available. FINDINGS: Lower chest: Clear lung bases. Hepatobiliary: No focal liver abnormality on this unenhanced exam. Cholecystectomy without biliary dilatation. Pancreas: No ductal dilatation or inflammation. Spleen: Normal  in size without focal abnormality. Adrenals/Urinary Tract: Normal adrenal glands. No hydronephrosis, perinephric edema, or renal calculi. Both ureters are decompressed without stones along the course. Urinary bladder is partially distended. No definite bladder wall thickening. No bladder stone. Stomach/Bowel: Normal appendix. No bowel obstruction or inflammation. Small volume of stool in the colon. Vascular/Lymphatic: Normal caliber abdominal aorta. No abdominopelvic adenopathy. Reproductive: Unremarkable unenhanced appearance of the uterus and adnexa. Other: No free air, free fluid or focal fluid collection. Musculoskeletal: There are no acute or suspicious osseous abnormalities. Transitional lumbosacral anatomy. IMPRESSION: No renal stones or obstructive uropathy. No  acute abnormality in the abdomen/pelvis. Electronically Signed   By: Keith Rake M.D.   On: 08/17/2022 21:13     PROCEDURES:  Critical Care performed: No  Procedures   MEDICATIONS ORDERED IN ED: Medications  cefTRIAXone (ROCEPHIN) 2 g in sodium chloride 0.9 % 100 mL IVPB (2 g Intravenous New Bag/Given 08/17/22 2143)     IMPRESSION / MDM / ASSESSMENT AND PLAN / ED COURSE  I reviewed the triage vital signs and the nursing notes.                              Differential diagnosis includes, but is not limited to, ovarian cyst, ovarian torsion, acute appendicitis, diverticulitis, urinary tract infection/pyelonephritis, endometriosis, bowel obstruction, colitis, renal colic, gastroenteritis, hernia, fibroids,  pregnancy related pain including ectopic pregnancy, etc.   Patient's presentation is most consistent with acute presentation with potential threat to life or bodily function.  Patient's diagnosis is consistent with UTI.  Patient with a mild leukocytosis on exam.  UA does confirm some slight dysuria and hematuria.  Squamous cells noted in the sample.  A separate clean sample was collected to be sent for urine culture.  Patient with CT stone study reassuring and shows no nephrolithiasis, hydronephrosis, or evidence of [place].  Patient treated with an empiric dose of Rocephin given IV in the ED.  Patient will be discharged home with prescriptions for Keflex and Pyridium. Patient is to follow up with her primary provider as needed or otherwise directed. Patient is given ED precautions to return to the ED for any worsening or new symptoms.   FINAL CLINICAL IMPRESSION(S) / ED DIAGNOSES   Final diagnoses:  Acute cystitis with hematuria  Flank pain     Rx / DC Orders   ED Discharge Orders          Ordered    cephALEXin (KEFLEX) 500 MG capsule  3 times daily        08/17/22 2234    phenazopyridine (PYRIDIUM) 100 MG tablet  3 times daily PRN        08/17/22 2234              Note:  This document was prepared using Dragon voice recognition software and may include unintentional dictation errors.    Melvenia Needles, PA-C 08/17/22 2327    Blake Divine, MD 08/17/22 2351

## 2022-08-17 NOTE — ED Notes (Signed)
First Nurse Note: Patient sent from [facility] for UTI symptoms. Sent to rule out pyelonephritis.

## 2022-08-17 NOTE — Discharge Instructions (Addendum)
You are being treated for a urinary tract infection.  Been given initial dose of antibiotic in the ED through IV.  Take the remaining prescription pills as directed.  Take the Pyridium for bladder spasm pain 3 times a day as needed.  Hydrate and take your bladder regularly.  Follow-up with your primary provider or return to the ED if needed.  You are being treated for a urinary tract infection.  Been given initial dose of antibiotic in the ED through IV.  Take the remaining prescription pills as directed.  Take the Pyridium for bladder spasm pain 3 times a day as needed.  Hydrate and take your bladder regularly.  Follow-up with your primary provider or return to the ED if needed.

## 2022-08-17 NOTE — ED Triage Notes (Signed)
Pt comes with c/o possible UTI. Pt states pain and blood in urine. Pt states left side flank pain.

## 2022-08-18 NOTE — ED Notes (Signed)
E signature pad not working. Pt educated on discharge instructions and verbalized understanding.  

## 2022-08-20 LAB — URINE CULTURE
Culture: >=100000 — AB
Special Requests: NORMAL

## 2022-08-21 NOTE — Progress Notes (Addendum)
ED Antimicrobial Stewardship Positive Culture Follow Up   Rayann Jian [place] is an 37 y.o. female who presented to [facility] on 08/17/2022 with a chief complaint of  Chief Complaint  Patient presents with   UTI    Recent Results (from the past 11 hour(s))  Urine Culture (for pregnant, neutropenic or urologic patients or patients with an indwelling urinary catheter)     Status: Abnormal   Collection Time: 08/17/22  5:52 PM   Specimen: Urine, Clean Catch  Result Value Ref Range Status   Specimen Description   Final    URINE, CLEAN CATCH Performed at [facility], [address] [place], [place] [address]    Special Requests   Final    Normal Performed at [facility], [address] [place], [place] [address]    Culture >=100,000 COLONIES/mL ESCHERICHIA COLI (A)  Final   Report Status 08/20/2022 FINAL  Final   Organism ID, Bacteria ESCHERICHIA COLI (A)  Final      Susceptibility   Escherichia coli - MIC*    AMPICILLIN >=32 RESISTANT Resistant     CEFAZOLIN >=64 RESISTANT Resistant     CEFEPIME <=0.12 SENSITIVE Sensitive     CEFTRIAXONE <=0.25 SENSITIVE Sensitive     CIPROFLOXACIN <=0.25 SENSITIVE Sensitive     GENTAMICIN <=1 SENSITIVE Sensitive     IMIPENEM <=0.25 SENSITIVE Sensitive     NITROFURANTOIN <=16 SENSITIVE Sensitive     TRIMETH/SULFA >=320 RESISTANT Resistant     AMPICILLIN/SULBACTAM >=32 RESISTANT Resistant     PIP/TAZO 64 INTERMEDIATE Intermediate     * >=100,000 COLONIES/mL ESCHERICHIA COLI    '[x]'$  Treated with cephalexin, organism resistant to prescribed antimicrobial '[]'$  Patient discharged originally without antimicrobial agent and treatment is now indicated  New antibiotic prescription: Ciprofloxacin 500 mg ppo BID x 7 days  ED Provider: Dr. Acquanetta Belling  Patient contacted  and informed of change in medication.  Script called into pharmacy.  Lorin Picket, PharmD 08/21/2022, 10:48 AM
